# Patient Record
Sex: Male | Born: 1995 | Race: White | Hispanic: No | Marital: Single | Smoking: Light tobacco smoker
Health system: Southern US, Community
[De-identification: ages and names within clinical notes are randomized; demographics above are authoritative.]

## PROBLEM LIST (undated history)

## (undated) DIAGNOSIS — F988 Other specified behavioral and emotional disorders with onset usually occurring in childhood and adolescence: Secondary | ICD-10-CM

## (undated) HISTORY — PX: WISDOM TOOTH EXTRACTION: SHX21

---
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
## 2018-08-12 NOTE — Discharge Instructions (Signed)
[facility] °[facility] °ENDOSCOPIC SINUS SURGERY °[facility] ° °What is Functional Endoscopic Sinus Surgery? ° The Surgery involves making the natural openings of the sinuses larger by removing the bony partitions that separate the sinuses from the nasal cavity.  The natural sinus lining is preserved as much as possible to allow the sinuses to resume normal function after the surgery.  In some patients nasal polyps (excessively swollen lining of the sinuses) may be removed to relieve obstruction of the sinus openings.  The surgery is performed through the nose using lighted scopes, which eliminates the need for incisions on the face.  A septoplasty is a different procedure which is sometimes performed with sinus surgery.  It involves straightening the boy partition that separates the two sides of your nose.  A crooked or deviated septum may need repair if is obstructing the sinuses or nasal airflow.  Turbinate reduction is also often performed during sinus surgery.  The turbinates are bony proturberances from the side walls of the nose which swell and can obstruct the nose in patients with sinus and allergy problems.  Their size can be surgically reduced to help relieve nasal obstruction. ° °What Can Sinus Surgery Do For Me? ° Sinus surgery can reduce the frequency of sinus infections requiring antibiotic treatment.  This can provide improvement in nasal congestion, post-nasal drainage, facial pressure and nasal obstruction.  Surgery will NOT prevent you from ever having an infection again, so it usually only for patients who get infections 4 or more times yearly requiring antibiotics, or for infections that do not clear with antibiotics.  It will not cure nasal allergies, so patients with allergies may still require medication to treat their allergies after surgery. Surgery may improve headaches related to sinusitis, however, some people will continue to  require medication to control sinus headaches related to allergies.  Surgery will do nothing for other forms of headache (migraine, tension or cluster). ° °What Are the Risks of Endoscopic Sinus Surgery? ° Current techniques allow surgery to be performed safely with little risk, however, there are rare complications that patients should be aware of.  Because the sinuses are located around the eyes, there is risk of eye injury, including blindness, though again, this would be quite rare. This is usually a result of bleeding behind the eye during surgery, which puts the vision oat risk, though there are treatments to protect the vision and prevent permanent disrupted by surgery causing a leak of the spinal fluid that surrounds the brain.  More serious complications would include bleeding inside the brain cavity or damage to the brain.  Again, all of these complications are uncommon, and spinal fluid leaks can be safely managed surgically if they occur.  The most common complication of sinus surgery is bleeding from the nose, which may require packing or cauterization of the nose.  Continued sinus have polyps may experience recurrence of the polyps requiring revision surgery.  Alterations of sense of smell or injury to the tear ducts are also rare complications.  ° °What is the Surgery Like, and what is the Recovery? ° The Surgery usually takes a couple of hours to perform, and is usually performed under a general anesthetic (completely asleep).  Patients are usually discharged home after a couple of hours.  Sometimes during surgery it is necessary to pack the nose to control bleeding, and the packing is left in place for 24 - 48 hours, and removed by your surgeon.    If a septoplasty was performed during the procedure, there is often a splint placed which must be removed after 5-7 days.   °Discomfort: Pain is usually mild to moderate, and can be controlled by prescription pain medication or acetaminophen (Tylenol).   Aspirin, Ibuprofen (Advil, Motrin), or Naprosyn (Aleve) should be avoided, as they can cause increased bleeding.  Most patients feel sinus pressure like they have a bad head cold for several days.  Sleeping with your head elevated can help reduce swelling and facial pressure, as can ice packs over the face.  A humidifier may be helpful to keep the mucous and blood from drying in the nose.  ° °Diet: There are no specific diet restrictions, however, you should generally start with clear liquids and a light diet of bland foods because the anesthetic can cause some nausea.  Advance your diet depending on how your stomach feels.  Taking your pain medication with food will often help reduce stomach upset which pain medications can cause. ° °Nasal Saline Irrigation: It is important to remove blood clots and dried mucous from the nose as it is healing.  This is done by having you irrigate the nose at least 3 - 4 times daily with a salt water solution.  We recommend using NeilMed Sinus Rinse (available at the drug store).  Fill the squeeze bottle with the solution, bend over a sink, and insert the tip of the squeeze bottle into the nose ½ of an inch.  Point the tip of the squeeze bottle towards the inside corner of the eye on the same side your irrigating.  Squeeze the bottle and gently irrigate the nose.  If you bend forward as you do this, most of the fluid will flow back out of the nose, instead of down your throat.   The solution should be warm, near body temperature, when you irrigate.   Each time you irrigate, you should use a full squeeze bottle.  ° °Note that if you are instructed to use Nasal Steroid Sprays at any time after your surgery, irrigate with saline BEFORE using the steroid spray, so you do not wash it all out of the nose. °Another product, Nasal Saline Gel (such as AYR Nasal Saline Gel) can be applied in each nostril 3 - 4 times daily to moisture the nose and reduce scabbing or crusting. ° °Bleeding:   Bloody drainage from the nose can be expected for several days, and patients are instructed to irrigate their nose frequently with salt water to help remove mucous and blood clots.  The drainage may be dark red or brown, though some fresh blood may be seen intermittently, especially after irrigation.  Do not blow you nose, as bleeding may occur. If you must sneeze, keep your mouth open to allow air to escape through your mouth. ° °If heavy bleeding occurs: Irrigate the nose with saline to rinse out clots, then spray the nose 3 - 4 times with Afrin Nasal Decongestant Spray.  The spray will constrict the blood vessels to slow bleeding.  Pinch the lower half of your nose shut to apply pressure, and lay down with your head elevated.  Ice packs over the nose may help as well. If bleeding persists despite these measures, you should notify your doctor.  Do not use the Afrin routinely to control nasal congestion after surgery, as it can result in worsening congestion and may affect healing.  ° ° ° °Activity: Return to work varies among patients. Most patients will be   out of work at least 5 - 7 days to recover.  Patient may return to work after they are off of narcotic pain medication, and feeling well enough to perform the functions of their job.  Patients must avoid heavy lifting (over 10 pounds) or strenuous physical for 2 weeks after surgery, so your employer may need to assign you to light duty, or keep you out of work longer if light duty is not possible.  NOTE: you should not drive, operate dangerous machinery, do any mentally demanding tasks or make any important legal or financial decisions while on narcotic pain medication and recovering from the general anesthetic.  °  °Call Your Doctor Immediately if You Have Any of the Following: °1. Bleeding that you cannot control with the above measures °2. Loss of vision, double vision, bulging of the eye or black eyes. °3. Fever over 101 degrees °4. Neck stiffness with  severe headache, fever, nausea and change in mental state. °You are always encourage to call anytime with concerns, however, please call with requests for pain medication refills during office hours. ° °Office Endoscopy: During follow-up visits your doctor will remove any packing or splints that may have been placed and evaluate and clean your sinuses endoscopically.  Topical anesthetic will be used to make this as comfortable as possible, though you may want to take your pain medication prior to the visit.  How often this will need to be done varies from patient to patient.  After complete recovery from the surgery, you may need follow-up endoscopy from time to time, particularly if there is concern of recurrent infection or nasal polyps. ° ° °General Anesthesia, Adult, Care After °These instructions provide you with information about caring for yourself after your procedure. Your health care provider may also give you more specific instructions. Your treatment has been planned according to current medical practices, but problems sometimes occur. Call your health care provider if you have any problems or questions after your procedure. °What can I expect after the procedure? °After the procedure, it is common to have: °· Vomiting. °· A sore throat. °· Mental slowness. ° °It is common to feel: °· Nauseous. °· Cold or shivery. °· Sleepy. °· Tired. °· Sore or achy, even in parts of your body where you did not have surgery. ° °Follow these instructions at home: °For at least 24 hours after the procedure: °· Do not: °? Participate in activities where you could fall or become injured. °? Drive. °? Use heavy machinery. °? Drink alcohol. °? Take sleeping pills or medicines that cause drowsiness. °? Make important decisions or sign legal documents. °? Take care of children on your own. °· Rest. °Eating and drinking °· If you vomit, drink water, juice, or soup when you can drink without vomiting. °· Drink enough fluid to  keep your urine clear or pale yellow. °· Make sure you have little or no nausea before eating solid foods. °· Follow the diet recommended by your health care provider. °General instructions °· Have a responsible adult stay with you until you are awake and alert. °· Return to your normal activities as told by your health care provider. Ask your health care provider what activities are safe for you. °· Take over-the-counter and prescription medicines only as told by your health care provider. °· If you smoke, do not smoke without supervision. °· Keep all follow-up visits as told by your health care provider. This is important. °Contact a health care provider if: °· You   continue to have nausea or vomiting at home, and medicines are not helpful. °· You cannot drink fluids or start eating again. °· You cannot urinate after 8-12 hours. °· You develop a skin rash. °· You have fever. °· You have increasing redness at the site of your procedure. °Get help right away if: °· You have difficulty breathing. °· You have chest pain. °· You have unexpected bleeding. °· You feel that you are having a life-threatening or urgent problem. °This information is not intended to replace advice given to you by your health care provider. Make sure you discuss any questions you have with your health care provider. °Document Released: 11/27/2000 Document Revised: 01/24/2016 Document Reviewed: 08/05/2015 °Elsevier Interactive Patient Education © 2018 [employer] ° °

## 2018-08-14 NOTE — Anesthesia Preprocedure Evaluation (Addendum)
Anesthesia Evaluation  Patient identified by MRN, date of birth, ID band Patient awake    Reviewed: Allergy & Precautions, NPO status , Patient's Chart, lab work & pertinent test results  History of Anesthesia Complications Negative for: history of anesthetic complications  Airway Mallampati: I   Neck ROM: Full    Dental no notable dental hx.    Pulmonary Current Smoker (vaping),    Pulmonary exam normal breath sounds clear to auscultation       Cardiovascular Exercise Tolerance: Good negative cardio ROS Normal cardiovascular exam Rhythm:Regular Rate:Normal     Neuro/Psych PSYCHIATRIC DISORDERS (ADD) negative neurological ROS     GI/Hepatic negative GI ROS,   Endo/Other  negative endocrine ROS  Renal/GU negative Renal ROS     Musculoskeletal   Abdominal   Peds  Hematology negative hematology ROS (+)   Anesthesia Other Findings   Reproductive/Obstetrics                            Anesthesia Physical Anesthesia Plan  ASA: II  Anesthesia Plan: General   Post-op Pain Management:    Induction: Intravenous  PONV Risk Score and Plan: 1 and Ondansetron and Dexamethasone  Airway Management Planned: Oral ETT  Additional Equipment:   Intra-op Plan:   Post-operative Plan: Extubation in OR  Informed Consent: I have reviewed the patients History and Physical, chart, labs and discussed the procedure including the risks, benefits and alternatives for the proposed anesthesia with the patient or authorized representative who has indicated his/her understanding and acceptance.     Plan Discussed with: CRNA  Anesthesia Plan Comments:        Anesthesia Quick Evaluation

## 2018-08-15 ENCOUNTER — Encounter
Admission: RE | Disposition: A | Discharge status: Home / Self Care | Payer: Self-pay | Source: Ambulatory Visit | Attending: Otolaryngology

## 2018-08-15 ENCOUNTER — Ambulatory Visit: Payer: BLUE CROSS/BLUE SHIELD | Admitting: Anesthesiology

## 2018-08-15 ENCOUNTER — Ambulatory Visit
Admission: RE | Admit: 2018-08-15 | Discharge: 2018-08-15 | Disposition: A | Discharge status: Home / Self Care | Payer: BLUE CROSS/BLUE SHIELD | Source: Ambulatory Visit | Attending: Otolaryngology | Admitting: Otolaryngology

## 2018-08-15 DIAGNOSIS — J343 Hypertrophy of nasal turbinates: Secondary | ICD-10-CM | POA: Insufficient documentation

## 2018-08-15 DIAGNOSIS — J3489 Other specified disorders of nose and nasal sinuses: Secondary | ICD-10-CM | POA: Diagnosis present

## 2018-08-15 DIAGNOSIS — M950 Acquired deformity of nose: Secondary | ICD-10-CM | POA: Diagnosis not present

## 2018-08-15 DIAGNOSIS — J342 Deviated nasal septum: Secondary | ICD-10-CM | POA: Insufficient documentation

## 2018-08-15 HISTORY — PX: NASAL TURBINATE REDUCTION: SHX2072

## 2018-08-15 HISTORY — DX: Other specified behavioral and emotional disorders with onset usually occurring in childhood and adolescence: F98.8

## 2018-08-15 HISTORY — PX: RHINOPLASTY: SHX2354

## 2018-08-15 SURGERY — RHINOPLASTY
Anesthesia: General | Site: Nose | Laterality: Bilateral

## 2018-08-15 MED ORDER — OXYMETAZOLINE HCL 0.05 % NA SOLN
2.0000 | Freq: Once | NASAL | Status: AC
  Administered 2018-08-15: 2 via NASAL

## 2018-08-15 MED ORDER — PHENYLEPHRINE HCL 0.5 % NA SOLN
NASAL | Status: DC | PRN
  Administered 2018-08-15: 15 mL via TOPICAL

## 2018-08-15 MED ORDER — HYDROCODONE-ACETAMINOPHEN 5-325 MG PO TABS: 1.0000 | ORAL_TABLET | Freq: Four times a day (QID) | ORAL | 0 refills | Status: AC | PRN

## 2018-08-15 MED ORDER — LACTATED RINGERS IV SOLN: INTRAVENOUS | Status: DC

## 2018-08-15 MED ORDER — LIDOCAINE HCL (CARDIAC) PF 100 MG/5ML IV SOSY
PREFILLED_SYRINGE | INTRAVENOUS | Status: DC | PRN
  Administered 2018-08-15: 40 mg via INTRAVENOUS

## 2018-08-15 MED ORDER — FENTANYL CITRATE (PF) 100 MCG/2ML IJ SOLN
INTRAMUSCULAR | Status: DC | PRN
  Administered 2018-08-15: 100 ug via INTRAVENOUS

## 2018-08-15 MED ORDER — PREDNISONE 10 MG PO TABS: ORAL_TABLET | ORAL | 0 refills | Status: AC

## 2018-08-15 MED ORDER — PROPOFOL 10 MG/ML IV BOLUS
INTRAVENOUS | Status: DC | PRN
  Administered 2018-08-15: 150 mg via INTRAVENOUS

## 2018-08-15 MED ORDER — DEXAMETHASONE SODIUM PHOSPHATE 4 MG/ML IJ SOLN
INTRAMUSCULAR | Status: DC | PRN
  Administered 2018-08-15: 10 mg via INTRAVENOUS

## 2018-08-15 MED ORDER — LACTATED RINGERS IV SOLN
INTRAVENOUS | Status: DC
  Administered 2018-08-15: 10:00:00 via INTRAVENOUS

## 2018-08-15 MED ORDER — DIPHENHYDRAMINE HCL 50 MG/ML IJ SOLN
12.5000 mg | Freq: Once | INTRAMUSCULAR | Status: AC
  Administered 2018-08-15: 12.5 mg via INTRAVENOUS

## 2018-08-15 MED ORDER — LIDOCAINE-EPINEPHRINE 1 %-1:100000 IJ SOLN
INTRAMUSCULAR | Status: DC | PRN
  Administered 2018-08-15: 5 mL
  Administered 2018-08-15: 7 mL

## 2018-08-15 MED ORDER — CEFAZOLIN SODIUM-DEXTROSE 2-4 GM/100ML-% IV SOLN
2.0000 g | Freq: Once | INTRAVENOUS | Status: AC
  Administered 2018-08-15: 2 g via INTRAVENOUS

## 2018-08-15 MED ORDER — ROCURONIUM BROMIDE 100 MG/10ML IV SOLN
INTRAVENOUS | Status: DC | PRN
  Administered 2018-08-15: 25 mg via INTRAVENOUS

## 2018-08-15 MED ORDER — CEPHALEXIN 500 MG PO CAPS: 500.0000 mg | ORAL_CAPSULE | Freq: Two times a day (BID) | ORAL | 0 refills | Status: AC

## 2018-08-15 MED ORDER — ONDANSETRON HCL 4 MG/2ML IJ SOLN
4.0000 mg | Freq: Once | INTRAMUSCULAR | Status: AC | PRN
  Administered 2018-08-15: 4 mg via INTRAVENOUS

## 2018-08-15 MED ORDER — FENTANYL CITRATE (PF) 100 MCG/2ML IJ SOLN: 25.0000 ug | INTRAMUSCULAR | Status: DC | PRN

## 2018-08-15 MED ORDER — OXYCODONE HCL 5 MG/5ML PO SOLN
5.0000 mg | Freq: Once | ORAL | Status: AC | PRN
Start: 2018-08-15 — End: 2018-08-15

## 2018-08-15 MED ORDER — OXYCODONE HCL 5 MG PO TABS
5.0000 mg | ORAL_TABLET | Freq: Once | ORAL | Status: AC | PRN
  Administered 2018-08-15: 5 mg via ORAL

## 2018-08-15 MED ORDER — ACETAMINOPHEN 10 MG/ML IV SOLN: 1000.0000 mg | Freq: Once | INTRAVENOUS | Status: DC | PRN

## 2018-08-15 MED ORDER — ACETAMINOPHEN 10 MG/ML IV SOLN
1000.0000 mg | Freq: Once | INTRAVENOUS | Status: AC
  Administered 2018-08-15: 1000 mg via INTRAVENOUS

## 2018-08-15 MED ORDER — MIDAZOLAM HCL 5 MG/5ML IJ SOLN
INTRAMUSCULAR | Status: DC | PRN
  Administered 2018-08-15: 2 mg via INTRAVENOUS

## 2018-08-15 MED ORDER — GLYCOPYRROLATE 0.2 MG/ML IJ SOLN
INTRAMUSCULAR | Status: DC | PRN
  Administered 2018-08-15: 0.1 mg via INTRAVENOUS

## 2018-08-15 MED ORDER — ONDANSETRON HCL 4 MG/2ML IJ SOLN
INTRAMUSCULAR | Status: DC | PRN
  Administered 2018-08-15: 4 mg via INTRAVENOUS

## 2018-08-15 SURGICAL SUPPLY — 35 items
AQUAPLAST 3X3 FLAT (MISCELLANEOUS) ×3
BASIN GRAD PLASTIC 32OZ STRL (MISCELLANEOUS) ×2 IMPLANT
CANISTER SUCT 1200ML W/VALVE (MISCELLANEOUS) ×3 IMPLANT
CATH IV 18X1 1/4 SAFELET (CATHETERS) ×2 IMPLANT
CLOSURE WOUND 1/2 X4 (GAUZE/BANDAGES/DRESSINGS) ×1
CLOSURE WOUND 1/4X4 (GAUZE/BANDAGES/DRESSINGS) ×1
COAGULATOR SUCT 8FR VV (MISCELLANEOUS) ×2 IMPLANT
CONT SPEC 4OZ CLIKSEAL STRL BL (MISCELLANEOUS) ×3 IMPLANT
ELECT REM PT RETURN 9FT ADLT (ELECTROSURGICAL) ×3
ELECTRODE REM PT RTRN 9FT ADLT (ELECTROSURGICAL) ×1 IMPLANT
GLOVE PI ULTRA LF STRL 7.5 (GLOVE) ×2 IMPLANT
GLOVE PI ULTRA NON LATEX 7.5 (GLOVE) ×4
GOWN STRL REUS W/ TWL LRG LVL3 (GOWN DISPOSABLE) ×2 IMPLANT
GOWN STRL REUS W/TWL LRG LVL3 (GOWN DISPOSABLE) ×6
IV CATH 18X1 1/4 SAFELET (CATHETERS) ×1
KIT TURNOVER KIT A (KITS) ×3 IMPLANT
NDL ANESTHESIA 27G X 3.5 (NEEDLE) IMPLANT
NDL HYPO 27GX1-1/4 (NEEDLE) ×1 IMPLANT
NEEDLE ANESTHESIA  27G X 3.5 (NEEDLE) ×2
NEEDLE ANESTHESIA 27G X 3.5 (NEEDLE) ×1 IMPLANT
NEEDLE HYPO 27GX1-1/4 (NEEDLE) ×3 IMPLANT
PACK ENT CUSTOM (PACKS) ×3 IMPLANT
PATTIES SURGICAL .5 X3 (DISPOSABLE) ×3 IMPLANT
SOL ANTI-FOG 6CC FOG-OUT (MISCELLANEOUS) ×1 IMPLANT
SOL FOG-OUT ANTI-FOG 6CC (MISCELLANEOUS) ×2
SPLINT AQUAPLAST 3X3 FLAT (MISCELLANEOUS) IMPLANT
SPONGE NEURO XRAY DETECT 1X3 (DISPOSABLE) ×2 IMPLANT
STRIP CLOSURE SKIN 1/2X4 (GAUZE/BANDAGES/DRESSINGS) ×2 IMPLANT
STRIP CLOSURE SKIN 1/4X4 (GAUZE/BANDAGES/DRESSINGS) ×2 IMPLANT
SUT CHROMIC 5 0 RB 1 27 (SUTURE) ×2 IMPLANT
SUT ETHILON 3-0 KS 30 BLK (SUTURE) ×3 IMPLANT
SUT PLAIN GUT 4-0 (SUTURE) ×3 IMPLANT
SYR 3ML LL SCALE MARK (SYRINGE) ×3 IMPLANT
TOWEL OR 17X26 4PK STRL BLUE (TOWEL DISPOSABLE) ×3 IMPLANT
WATER STERILE IRR 250ML POUR (IV SOLUTION) ×2 IMPLANT

## 2018-08-15 NOTE — Op Note (Signed)
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  He had a nasal dorsal hump and a long nose with a bulbous tip.  He had straightening of the septum with trimming of the inferior turbinates and then removal of the dorsal hump and bringing back the nasal bones to the midline to hold the upper septum in the midline.  He had trimming of the cephalic edge of the lower lateral cartilages to define the tip better.   Procedure: The patient was given general anesthesia by oral endotracheal intubation.  He was placed in a supine position.  His nose was prepped using 7 mL of 1% Xylocaine with epi 1: 100,000 for infiltration of the nasal septum and lateral nasal walls.  Another 5 mL was used in the nasal dorsum and lateral nasal walls subcutaneously.  His nose was then packed with cottonoid pledgets soaked in phenylephrine and Xylocaine.  The septum was markedly deviated to the right side completely obstructing the airway here.  He was prepped and draped in a sterile fashion.  The nose was visualized both sides with 0 degrees scope to see the deviation the septum his nose was pulled way off to the left side and the tip was pulled to the left side as well.  A complete transfixion incision was created with extension into intercartilaginous incisions on both sides with elevation of the skin over the nasal dorsum bilaterally through the intercartilaginous incisions.  At the complete hemitransfixion incision the mucoperichondrial was elevated on both sides the quadrangular plate and then over the ethmoid plate as well.  The bone was buckled to the right and the cartilage was  buckled on itself and fractured to the right side as well.  There is some slight overhanging cartilage on the maxillary crest.  Some of the inferior and posterior quadrangular plate was trimmed for freeing up the anterior quadrangular plate to be able to slide back towards the midline.  Some the maxillary crest was fractured and removed as well to allow the septum to pull back towards the midline.  The upper portion of the septum was still off the left because of the nasal bones to the left side.  This opened up the right nasal airway more by allowing the lower portion of septum to come back to the midline.  3 oh chromics were used and through and through stitch on a Keith needle to anchor the quadrangular cartilage to the anterior septal spine.  4-0 plain gut was then used to sew a through and through whipstitch fashion for closing of the mucosal flaps to the septum in midline.  Once the mucosal flaps were sutured together the airway was much better on the right-hand side.  The inferior turbinates were trimmed slightly longer anterior inferior border and then outfractured.  Electrocautery was used along the trimmed edges to help control bleeding.  This helped open the airway as well.  A straight flat chisel was used to remove a very thin piece of dorsal home only approximately 2 mm in size.  This did open up the nasal bones so that you could feel the ridge of nasal bones on  each side.  The nasal bones were filed to remove any rough edges here.  Medial osteotomies were done to free up the superior edge.  Lateral inferior tunnels were then created on both sides using a Industrial/product designer.  The curved guarded chisels were then used to create low lateral osteotomies to free up the nasal bones on each side.  There is a piece of nasal bone and the top bridge on the right that was still intact.  A small chisel was used through the skin to fracture this piece of bone and loosened up and move it as well.  With the nasal  bones loose now they could slide back towards the middle and this now held not only the dorsum in the middle but the tip in the middle ear as well.  There is no significant bruising currently.  The tip was bulbous with large lower lateral cartilages that were quite thick.  The superior surface of the lower lateral cartilages were freed by inverting them and then trimmed of about 5 mm on both sides.  These appear to be symmetrical and help to define the nasal tip more.  The caudal edge of the septal cartilage was also trimmed of 2 to 3 mm along its caudal edge to allow slight rotation of the nasal tip.  This now defined the nasal tip and elevated at slightly to go along with the straighter nasal dorsum that no longer had a dorsal home.  5-0 chromic's were used for closing the intercartilaginous incisions.  3 oh chromics were used through and through for closing remaining upper portion of the transfixion incision.  The nose was washed and prepped with alcohol.  Steri-Strips were placed over the nasal dorsum using 1/2 inch Steri-Strips.  Quarter inch Steri-Strips were then used to help hold the tip in position.  An Aquaplast cast was then cut to fit and laid over the Steri-Strips and allowed to cool off and dry in that area.  Tape was then placed over the nasal dorsum to hold Aquaplast in place.  The inside of the nose was revisualized and there was good open airway on both sides.  Xomed 0.5 mm regular size nasal stents were then trimmed and placed on both sides the nasal septum.  These were held in place with a 3-0 nylon through and through suture.  There is a good airway bilaterally.  There is no significant bruising.  The nose was straight and the nasal dorsum was flat.  The patient tolerated the procedure well.  He was awakened and taken to the recovery room in satisfactory condition.  There were no operative complications.  Dispo:   To PACU to be discharged home  Plan: To follow-up in the office and 6 days  for removal of the cast and removal of the nasal stents.  He will rest at home with his head elevated for the next several days.  He will start flushes in his nose tomorrow to help wash out any old blood.  He can sniff but should not blow his nose.  He will use a prednisone taper starting tomorrow and beyond some antibiotics for prevention.  He has Norco 5/325 tablets that he can use for pain if necessary.  He will let me know if he has any challenges.  Adam Cole Sessions Asal Teas  08/15/2018 1:15 PM

## 2018-08-15 NOTE — Transfer of Care (Signed)
Immediate Anesthesia Transfer of Care Note  Patient: Adam Cole  Procedure(s) Performed: SEPTORHINOPLASTY (Bilateral Nose) BILATERAL INFERIOR TURBINATE REDUCTION (Bilateral Nose)  Patient Location: PACU  Anesthesia Type: General  Level of Consciousness: awake, alert  and patient cooperative  Airway and Oxygen Therapy: Patient Spontanous Breathing and Patient connected to supplemental oxygen  Post-op Assessment: Post-op Vital signs reviewed, Patient's Cardiovascular Status Stable, Respiratory Function Stable, Patent Airway and No signs of Nausea or vomiting  Post-op Vital Signs: Reviewed and stable  Complications: No apparent anesthesia complications

## 2018-08-15 NOTE — H&P (Signed)
H&P has been reviewed and patient reevaluated, no changes necessary. To be downloaded later.  

## 2018-08-15 NOTE — Anesthesia Procedure Notes (Signed)
Procedure Name: Intubation Date/Time: 08/15/2018 11:28 AM Performed by: Jimmy PicketAmyot, Sury Wentworth, CRNA Pre-anesthesia Checklist: Patient identified, Emergency Drugs available, Suction available, Patient being monitored and Timeout performed Patient Re-evaluated:Patient Re-evaluated prior to induction Oxygen Delivery Method: Circle system utilized Preoxygenation: Pre-oxygenation with 100% oxygen Induction Type: IV induction Ventilation: Mask ventilation without difficulty Laryngoscope Size: Miller and 3 Grade View: Grade I Tube type: Oral Rae Tube size: 7.5 mm Number of attempts: 1 Placement Confirmation: ETT inserted through vocal cords under direct vision,  positive ETCO2 and breath sounds checked- equal and bilateral Tube secured with: Tape Dental Injury: Teeth and Oropharynx as per pre-operative assessment

## 2018-08-15 NOTE — Anesthesia Postprocedure Evaluation (Signed)
Anesthesia Post Note  Patient: Adam Cole  Procedure(s) Performed: SEPTORHINOPLASTY (Bilateral Nose) BILATERAL INFERIOR TURBINATE REDUCTION (Bilateral Nose)  Patient location during evaluation: PACU Anesthesia Type: General Level of consciousness: awake and alert, oriented and patient cooperative Pain management: pain level controlled Vital Signs Assessment: post-procedure vital signs reviewed and stable Respiratory status: spontaneous breathing, nonlabored ventilation and respiratory function stable Cardiovascular status: blood pressure returned to baseline and stable Postop Assessment: adequate PO intake Anesthetic complications: no    Reed BreechAndrea Johnell Bas

## 2018-08-16 ENCOUNTER — Encounter: Payer: Self-pay | Admitting: Otolaryngology
